# Patient Record
Sex: Female | Born: 1960 | Race: White | Hispanic: No | Marital: Single | Smoking: Current every day smoker
Health system: Southern US, Community
[De-identification: ages and names within clinical notes are randomized; demographics above are authoritative.]

## PROBLEM LIST (undated history)

## (undated) DIAGNOSIS — R7989 Other specified abnormal findings of blood chemistry: Secondary | ICD-10-CM

## (undated) DIAGNOSIS — C539 Malignant neoplasm of cervix uteri, unspecified: Secondary | ICD-10-CM

## (undated) DIAGNOSIS — D696 Thrombocytopenia, unspecified: Secondary | ICD-10-CM

## (undated) DIAGNOSIS — D751 Secondary polycythemia: Secondary | ICD-10-CM

## (undated) HISTORY — DX: Secondary polycythemia: D75.1

## (undated) HISTORY — DX: Thrombocytopenia, unspecified: D69.6

## (undated) HISTORY — PX: CHOLECYSTECTOMY: SHX55

## (undated) HISTORY — DX: Malignant neoplasm of cervix uteri, unspecified: C53.9

## (undated) HISTORY — PX: TUBAL LIGATION: SHX77

## (undated) HISTORY — PX: BREAST SURGERY: SHX581

## (undated) HISTORY — DX: Other specified abnormal findings of blood chemistry: R79.89

---

## 2000-12-31 ENCOUNTER — Encounter: Payer: Self-pay | Admitting: Obstetrics and Gynecology

## 2000-12-31 ENCOUNTER — Ambulatory Visit (HOSPITAL_COMMUNITY): Admission: RE | Admit: 2000-12-31 | Discharge: 2000-12-31 | Payer: Self-pay | Admitting: Obstetrics and Gynecology

## 2003-03-26 HISTORY — PX: ABDOMINAL HYSTERECTOMY: SHX81

## 2003-11-10 ENCOUNTER — Observation Stay (HOSPITAL_COMMUNITY): Admission: RE | Admit: 2003-11-10 | Discharge: 2003-11-11 | Payer: Self-pay | Admitting: Obstetrics and Gynecology

## 2005-08-08 ENCOUNTER — Ambulatory Visit: Payer: Self-pay | Admitting: Internal Medicine

## 2005-08-26 ENCOUNTER — Ambulatory Visit: Payer: Self-pay | Admitting: Internal Medicine

## 2006-03-21 ENCOUNTER — Emergency Department: Payer: Self-pay | Admitting: Internal Medicine

## 2007-03-26 LAB — CONVERTED CEMR LAB
Pap Smear: NORMAL
Pap Smear: NORMAL

## 2007-05-19 IMAGING — US ULTRASOUND LEFT BREAST
1 series · 15 of 15 positions shown · non-contrast
Comparison: none

REASON FOR EXAM: LEFT breast mass
COMMENTS:

[Series 1: ultrasound left breast · 15 of 15 slices shown]
[im 1/15]
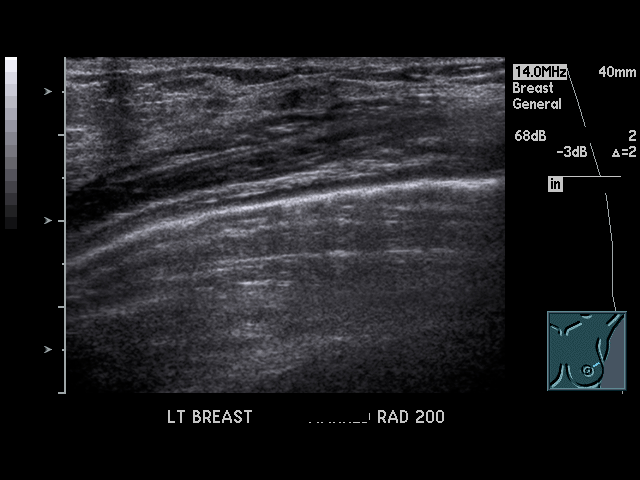
[im 2/15]
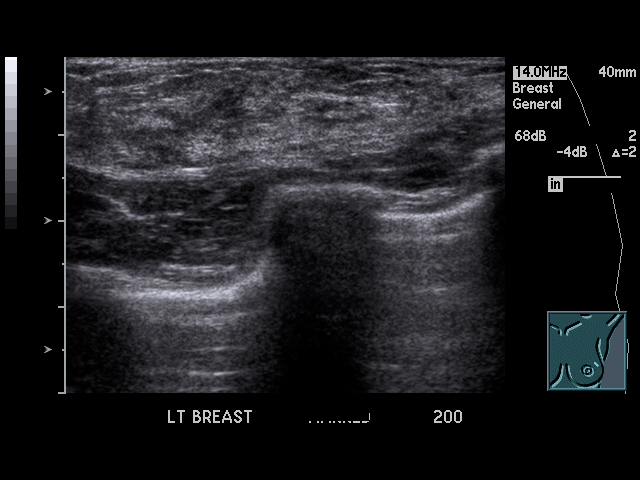
[im 3/15]
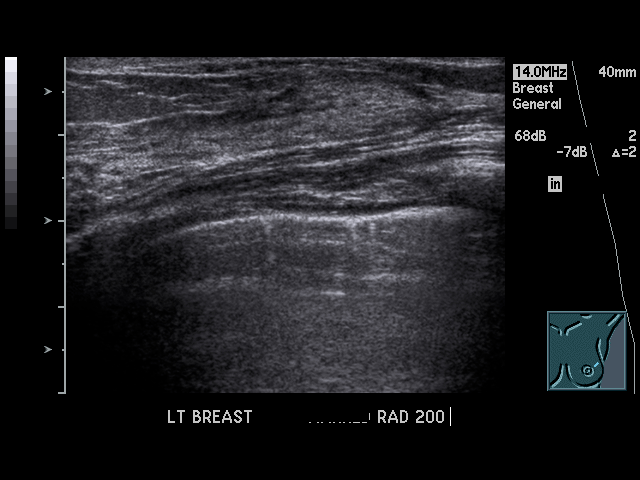
[im 4/15]
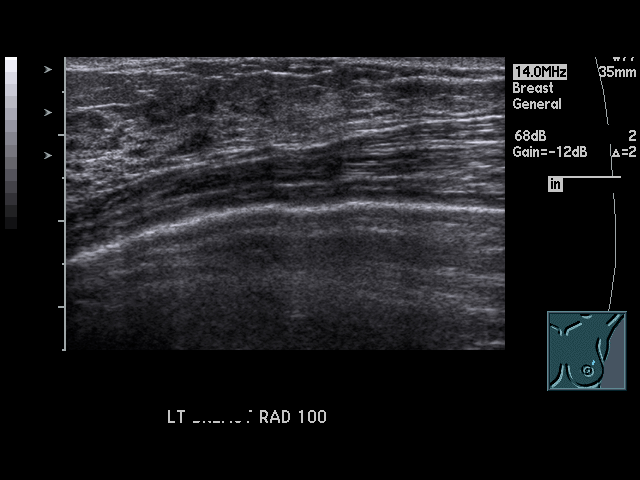
[im 5/15]
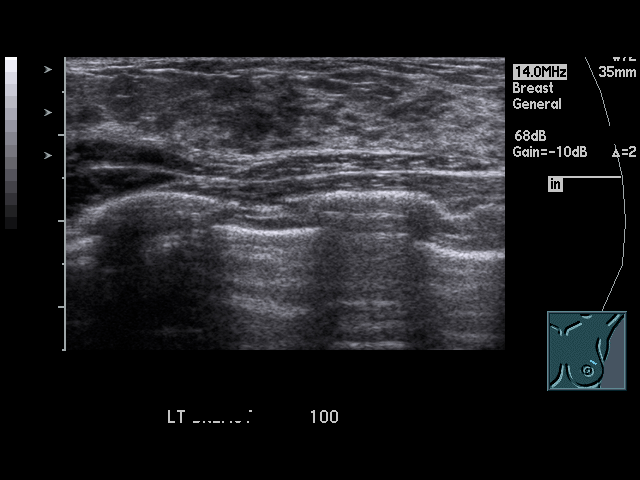
[im 6/15]
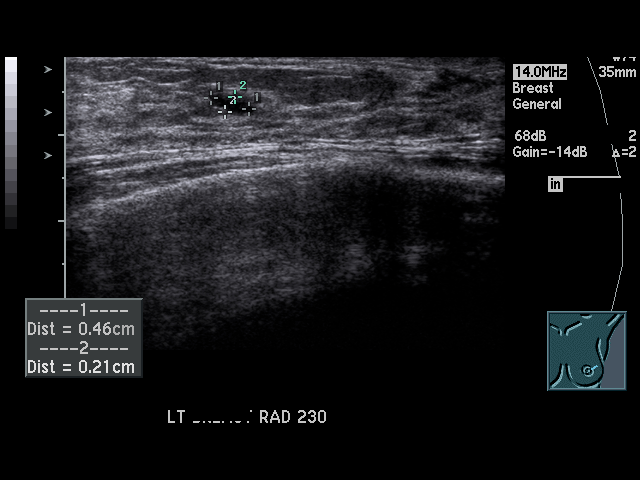
[im 7/15]
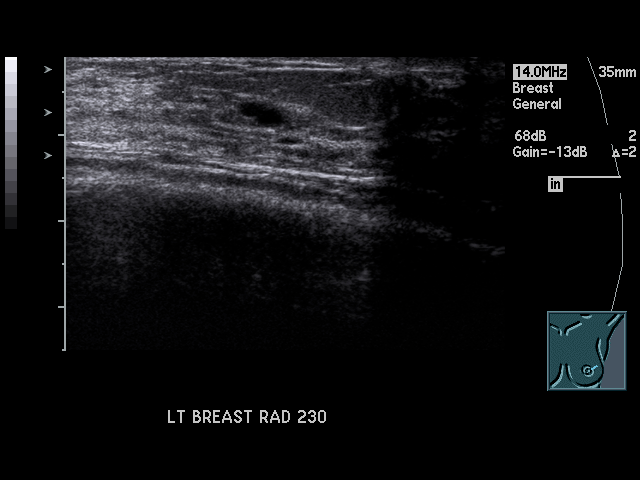
[im 8/15]
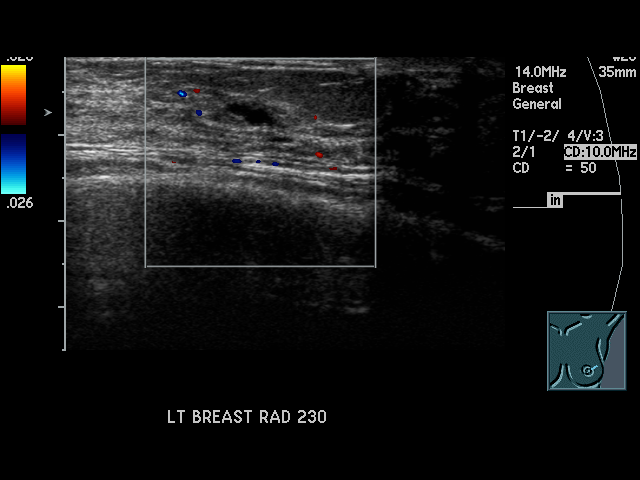
[im 9/15]
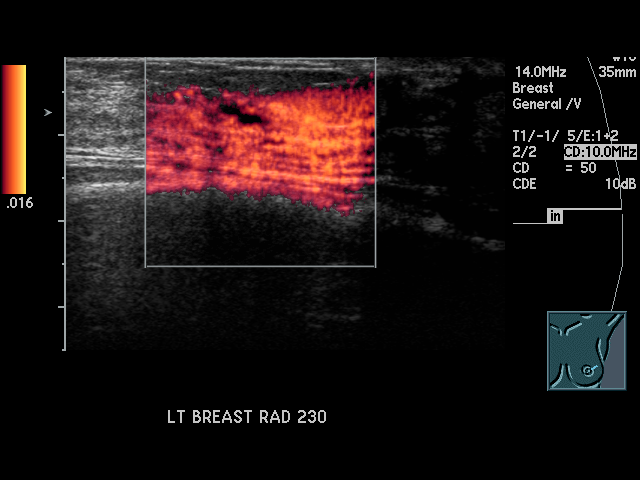
[im 10/15]
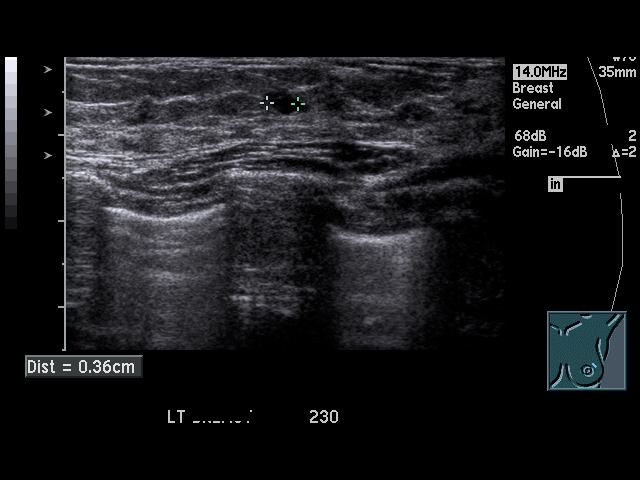
[im 11/15]
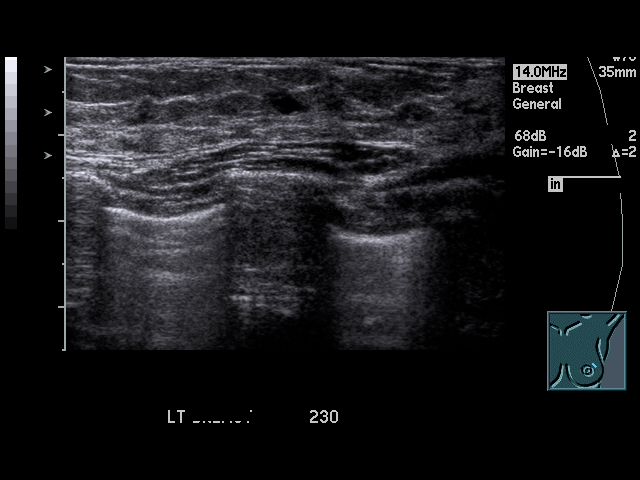
[im 12/15]
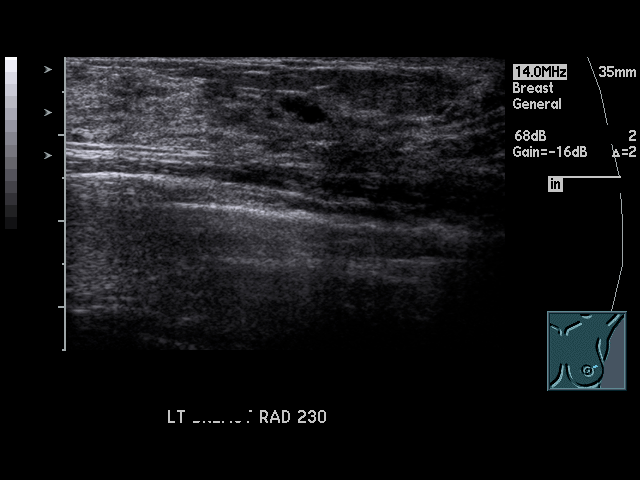
[im 13/15]
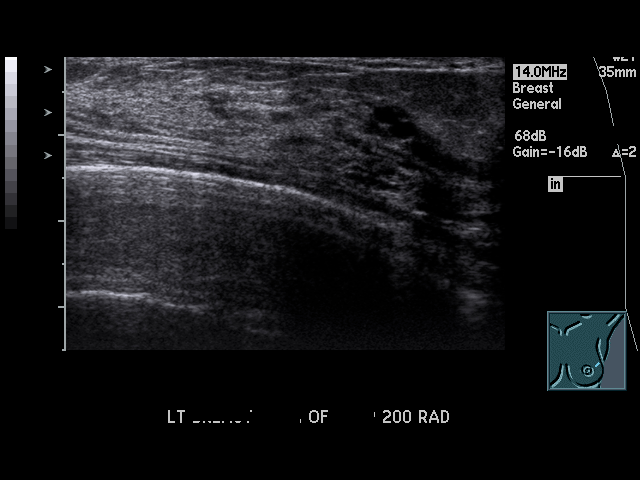
[im 14/15]
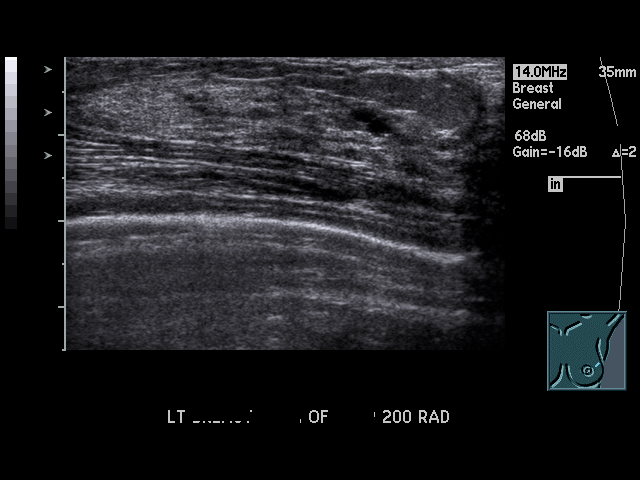
[im 15/15]
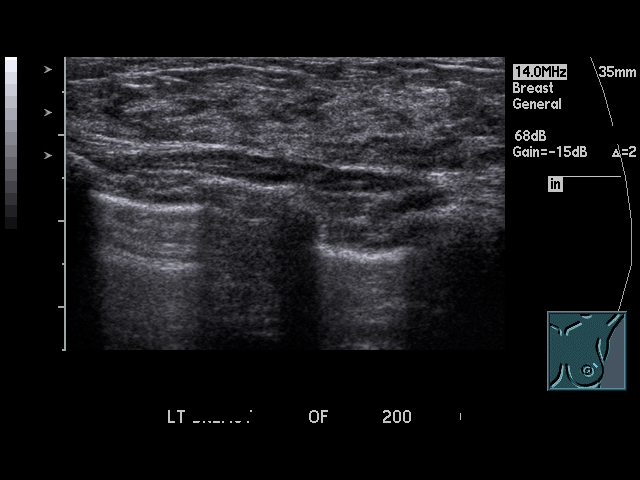

[15 of 15 positions shown; findings below may reference images not displayed]

PROCEDURE:     US  - US BREAST LEFT  - August 08, 2005  [DATE]

RESULT:          The study was presented for evaluation on 08/16/2005.

The region of palpable abnormality was evaluated.  This was evaluated from
the 1 o'clock to [DATE] position.  At the [DATE] position, a lobulated area of
what appear to be two adjacent cysts are identified.  These areas
demonstrate increased through transmission, echogenic sonographic
architecture, and an imperceptible wall.  There appears to be a septum or
possibly interface between the walls along the center of these areas.  There
is no evidence of flow appreciated.  No further sonographic abnormalities
are appreciated.
IMPRESSION: Findings which appear to be consistent with a cluster
of benign cysts in the region of palpable abnormality.  The entire cluster
measures approximately 4.6 x 2.1 mm.  Please refer to the bilateral
mammogram for a completed discussion.

## 2007-12-30 IMAGING — CR DG ANKLE COMPLETE 3+V*L*
1 series · 5 of 5 positions shown · non-contrast
Comparison: none

REASON FOR EXAM: swelling, pain
COMMENTS:

PROCEDURE:     DXR - DXR ANKLE LEFT COMPLETE  - March 21, 2006 [DATE]
RESULT:     Five views of the LEFT ankle show no fracture, dislocation, or
other acute bony abnormality.  The ankle mortise is well maintained.  There
is noted soft tissue swelling about the lateral malleolus.

[Series 1: view not recorded · 0.17mm/px · 5 of 5 slices shown]
[im 1/5]
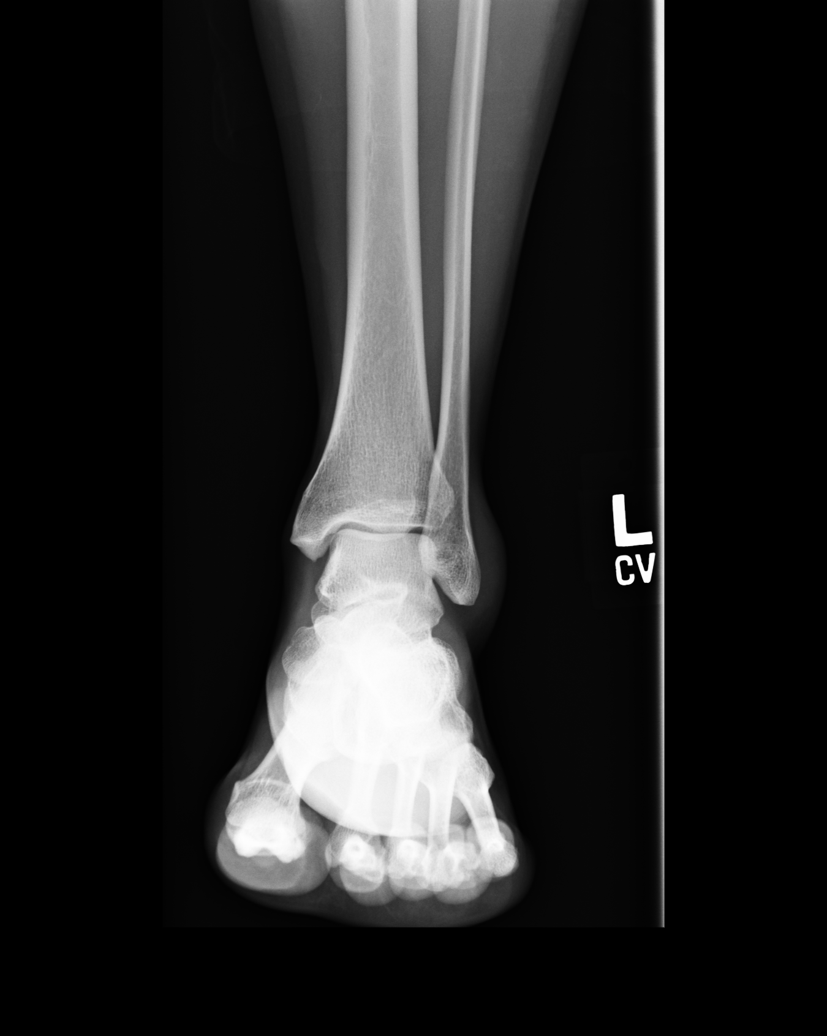
[im 2/5]
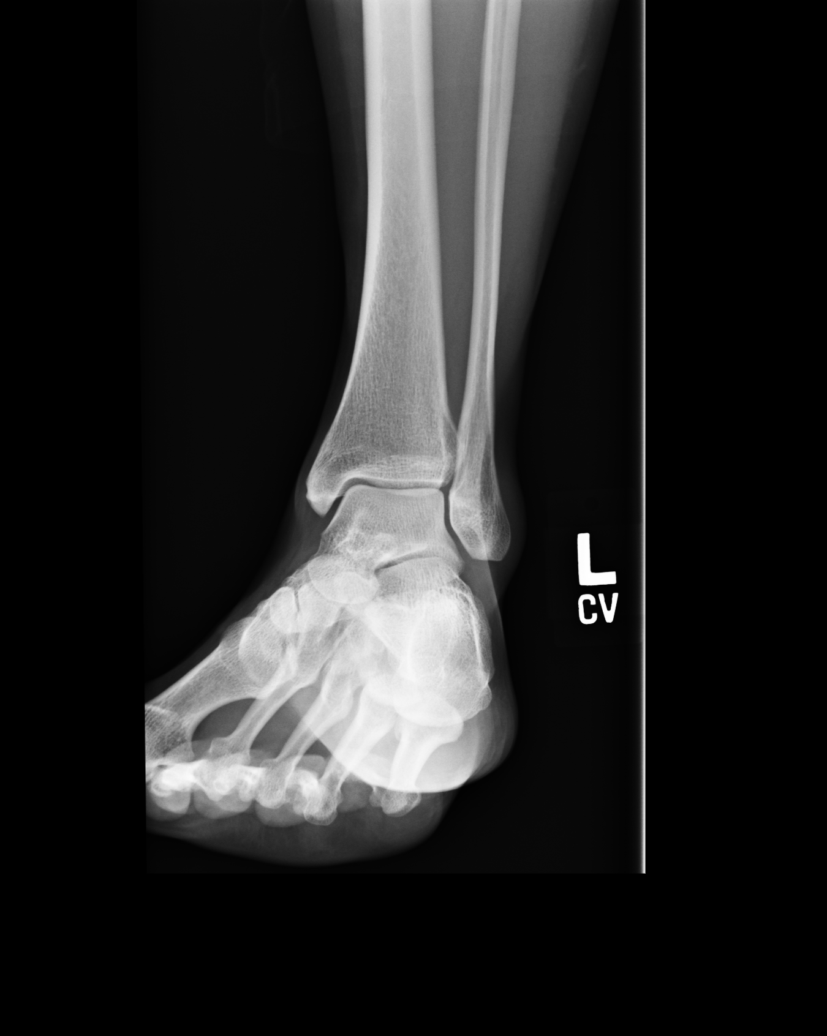
[im 3/5]
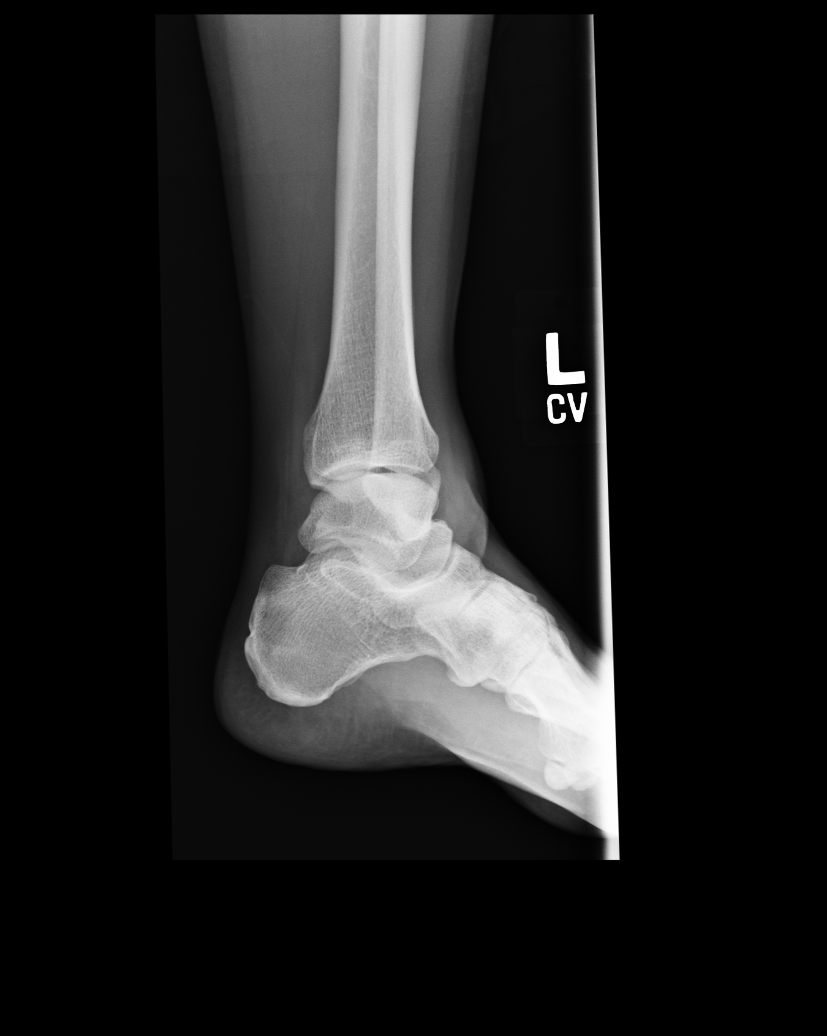
[im 4/5]
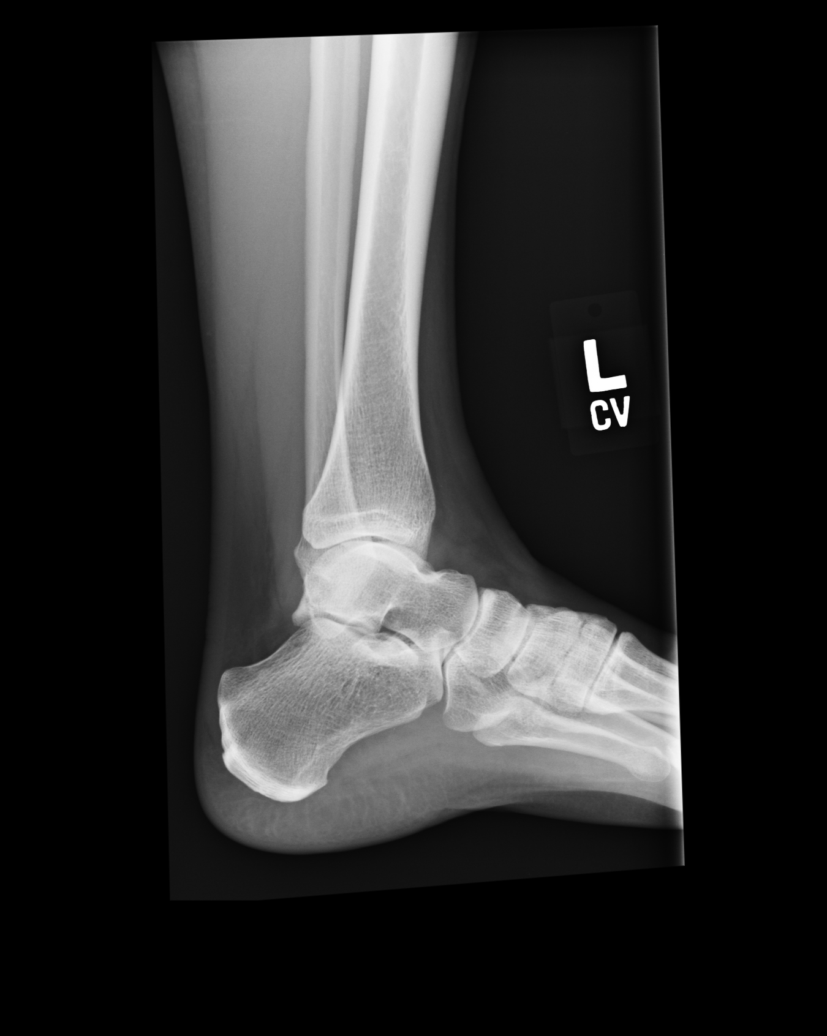
[im 5/5]
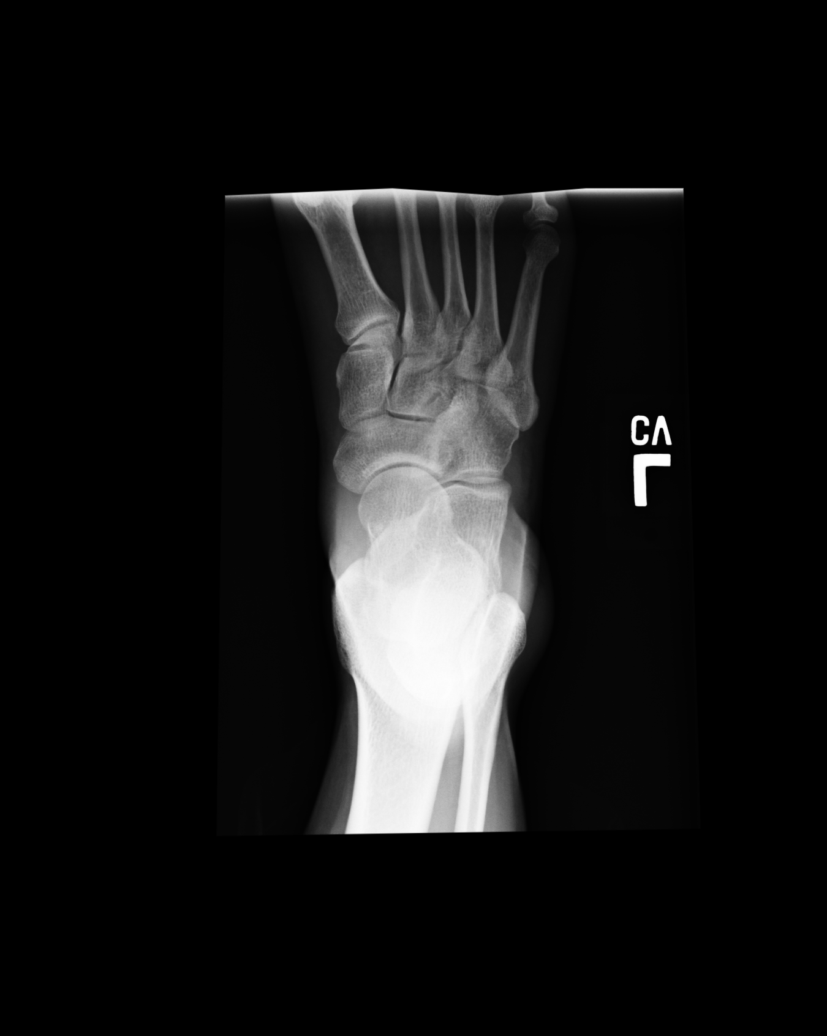

[5 of 5 positions shown; findings below may reference images not displayed]

IMPRESSION: No acute bony abnormalities are identified.

## 2008-09-30 ENCOUNTER — Ambulatory Visit: Payer: Self-pay | Admitting: Family Medicine

## 2008-09-30 DIAGNOSIS — G43009 Migraine without aura, not intractable, without status migrainosus: Secondary | ICD-10-CM | POA: Insufficient documentation

## 2008-09-30 DIAGNOSIS — Z8659 Personal history of other mental and behavioral disorders: Secondary | ICD-10-CM

## 2008-09-30 DIAGNOSIS — F17200 Nicotine dependence, unspecified, uncomplicated: Secondary | ICD-10-CM

## 2008-09-30 DIAGNOSIS — R413 Other amnesia: Secondary | ICD-10-CM

## 2008-09-30 DIAGNOSIS — J309 Allergic rhinitis, unspecified: Secondary | ICD-10-CM | POA: Insufficient documentation

## 2008-10-07 ENCOUNTER — Ambulatory Visit: Payer: Self-pay | Admitting: Family Medicine

## 2008-10-10 LAB — CONVERTED CEMR LAB
ALT: 15 units/L (ref 0–35)
AST: 16 units/L (ref 0–37)
Albumin: 4.1 g/dL (ref 3.5–5.2)
Alkaline Phosphatase: 56 units/L (ref 39–117)
BUN: 11 mg/dL (ref 6–23)
Basophils Absolute: 0 10*3/uL (ref 0.0–0.1)
Basophils Relative: 0.3 % (ref 0.0–3.0)
Bilirubin, Direct: 0.1 mg/dL (ref 0.0–0.3)
CO2: 28 meq/L (ref 19–32)
Calcium: 9.3 mg/dL (ref 8.4–10.5)
Chloride: 106 meq/L (ref 96–112)
Cholesterol: 199 mg/dL (ref 0–200)
Creatinine, Ser: 1 mg/dL (ref 0.4–1.2)
Eosinophils Absolute: 0.3 10*3/uL (ref 0.0–0.7)
Eosinophils Relative: 5.5 % — ABNORMAL HIGH (ref 0.0–5.0)
GFR calc non Af Amer: 63.25 mL/min (ref >60)
Glucose, Bld: 93 mg/dL (ref 70–99)
HCT: 41 % (ref 36.0–46.0)
HDL: 49.2 mg/dL (ref >39.00)
Hemoglobin: 14.2 g/dL (ref 12.0–15.0)
LDL Cholesterol: 138 mg/dL — ABNORMAL HIGH (ref 0–99)
Lymphocytes Relative: 28.3 % (ref 12.0–46.0)
Lymphs Abs: 1.6 10*3/uL (ref 0.7–4.0)
MCHC: 34.6 g/dL (ref 30.0–36.0)
MCV: 92 fL (ref 78.0–100.0)
Monocytes Absolute: 0.5 10*3/uL (ref 0.1–1.0)
Monocytes Relative: 8.2 % (ref 3.0–12.0)
Neutro Abs: 3.3 10*3/uL (ref 1.4–7.7)
Neutrophils Relative %: 57.7 % (ref 43.0–77.0)
Platelets: 152 10*3/uL (ref 150.0–400.0)
Potassium: 4.1 meq/L (ref 3.5–5.1)
RBC: 4.46 M/uL (ref 3.87–5.11)
RDW: 12.3 % (ref 11.5–14.6)
Sodium: 139 meq/L (ref 135–145)
TSH: 0.92 microintl units/mL (ref 0.35–5.50)
Total Bilirubin: 1 mg/dL (ref 0.3–1.2)
Total CHOL/HDL Ratio: 4
Total Protein: 6.7 g/dL (ref 6.0–8.3)
Triglycerides: 58 mg/dL (ref 0.0–149.0)
VLDL: 11.6 mg/dL (ref 0.0–40.0)
Vit D, 25-Hydroxy: 60 ng/mL (ref 30–89)
Vitamin B-12: 1034 pg/mL — ABNORMAL HIGH (ref 211–911)
WBC: 5.7 10*3/uL (ref 4.5–10.5)

## 2008-10-28 ENCOUNTER — Ambulatory Visit: Payer: Self-pay | Admitting: Family Medicine

## 2008-11-10 ENCOUNTER — Ambulatory Visit: Payer: Self-pay | Admitting: Family Medicine

## 2008-11-10 DIAGNOSIS — K1120 Sialoadenitis, unspecified: Secondary | ICD-10-CM

## 2008-11-10 DIAGNOSIS — J069 Acute upper respiratory infection, unspecified: Secondary | ICD-10-CM | POA: Insufficient documentation

## 2009-01-06 ENCOUNTER — Ambulatory Visit: Payer: Self-pay | Admitting: Obstetrics and Gynecology

## 2009-03-06 ENCOUNTER — Ambulatory Visit: Payer: Self-pay | Admitting: Family Medicine

## 2009-03-06 DIAGNOSIS — M79609 Pain in unspecified limb: Secondary | ICD-10-CM

## 2010-08-10 NOTE — Discharge Summary (Signed)
NAME:  Meghan Lucero, Meghan Lucero                         ACCOUNT NO.:  [id number]   MEDICAL RECORD NO.:  [id number]                   PATIENT TYPE:  INP   LOCATION:  A420                                 FACILITY:  [facility]   PHYSICIAN:  Tilda Burrow, M.D.              DATE OF BIRTH:  09-06-1960   DATE OF ADMISSION:  11/10/2003  DATE OF DISCHARGE:                                 DISCHARGE SUMMARY   ADMITTING DIAGNOSES:  1. Pelvic relaxation.  2. Rectocele.  3. Dysmenorrhea.   DISCHARGE DIAGNOSES:  1. Pelvic relaxation.  2. Rectocele.  3. Dysmenorrhea.   PROCEDURE:  Vaginal hysterectomy and posterior repair on 11/10/2003.   DISCHARGE MEDICATIONS:  1. Vicodin 5/500 #20 one to two q.4h. p.r.n. pain.  2. Colace stool softener twice daily for 30 days.  3. Limitations including:  No driving x1 week and no heavy lifting x 1 month     with standard postoperative monitoring of temperature and overall     condition.   DETAILS OF HOSPITALIZATION:  [place] was admitted on the 18th and underwent  uncomplicated vaginal hysterectomy and posterior repair as described in the  operative note and then went home the following morning.  Postoperative  hemoglobin was 12.5, hematocrit 35 compared to 15 and 42 respectively preop.  She will be followed up in 4 weeks in our office or earlier p.r.n. fever,  increased discomfort, or patient concerns.     ___________________________________________                                         Tilda Burrow, M.D.   JVF/MEDQ  D:  11/11/2003  T:  11/11/2003  Job:  [id number]

## 2010-08-10 NOTE — Op Note (Signed)
NAME:  Meghan Lucero, Meghan Lucero                         ACCOUNT NO.:  [id number]   MEDICAL RECORD NO.:  [id number]                   PATIENT TYPE:  INP   LOCATION:  A420                                 FACILITY:  [facility]   PHYSICIAN:  Tilda Burrow, M.D.              DATE OF BIRTH:  August 27, 1960   DATE OF PROCEDURE:  11/10/2003  DATE OF DISCHARGE:                                 OPERATIVE REPORT   PREOPERATIVE DIAGNOSES:  1. Pelvic relaxation with rectocele.  2. Dysmenorrhea.   POSTOPERATIVE DIAGNOSES:  1. Pelvic relaxation with rectocele.  2. Dysmenorrhea.   PROCEDURE:  1. Vaginal hysterectomy.  2. Posterior repair.   SURGEON:  Tilda Burrow, M.D.   ASSISTANTAsencion Noble, CST-FA   ANESTHESIA:  General   COMPLICATIONS:  None.   ESTIMATED BLOOD LOSS:  50 cc.   FINDINGS:  Uterine descensus to introitus with light traction.  Lax  posterior peritoneal support.   DETAILS OF PROCEDURE:  The patient was taken to the operating room and  prepped and draped for vaginal procedure with high leg support.  The patient  has posterior colpotomy incision performed with ease with weighted speculum  in the vagina.  A vaginal bib had been placed over the perineum.  The  uterosacral ligaments were clamped, cut and tagged with #0 chromic.  The  anterior cervical vaginal fornix was entered and the bladder elevated and  the anterior peritoneum opened.  The lateral vaginal mucosa was released and  then lower cardinal ligaments clamped, cut, and suture ligated with #0  chromic.  The upper cardinal ligaments and broad ligaments were serially  clamped, cut and suture ligated in a serial fashion using small bites and #0  chromic ligature.  The ovaries and tubes could be visualized and were  grossly normal.  They were left in situ.  The hysterectomy was quite easy  with minimal blood loss.  The pedicles were inspected and found hemostatic.  The peritoneum closed in a transverse running fashion rather than  purse-  string.  The uterosacral ligaments were pulled slightly together in the  midline with a 3-0 monofilament suture placed approximately 8-10 mm above  the vaginal cuff catching the inner portions of uterosacral ligaments.  The  cuff, itself, was then closed in a transverse fashion with good hemostasis  and pelvic support.   POSTERIOR REPAIR:  Posterior repair involved opening the posterior vaginal  mucosa at the introitus, elevating the vaginal mucosa off the underlying  connective tissue, doing a double gloved digital exam with the right index  finger, using Allis clamps to grasp perirectal tissue on either side and  pulling it into the midline.  Whereupon a series of #0 Dexon, interrupted  mattress sutures were placed to rebuild the perineal body catching  __________ cavernosus tissues and rebuilding the levator plate.   The vaginal mucosa was pulled together in the midline and minimal trimming  performed.  The patient tolerated the procedure well with excellent  hemostasis, and so went to the recovery room without vaginal packing.  Foley  catheter was placed with clear urine obtained.   ESTIMATED BLOOD LOSS:  50 cc.      ___________________________________________                                            Tilda Burrow, M.D.   JVF/MEDQ  D:  11/11/2003  T:  11/11/2003  Job:  [id number]

## 2011-04-05 ENCOUNTER — Ambulatory Visit: Payer: Self-pay | Admitting: Obstetrics and Gynecology

## 2011-06-13 ENCOUNTER — Ambulatory Visit (INDEPENDENT_AMBULATORY_CARE_PROVIDER_SITE_OTHER): Payer: Medicare HMO | Admitting: Family Medicine

## 2011-06-13 ENCOUNTER — Encounter: Payer: Self-pay | Admitting: Family Medicine

## 2011-06-13 ENCOUNTER — Encounter: Payer: Self-pay | Admitting: *Deleted

## 2011-06-13 VITALS — BP 120/72 | HR 89 | Temp 97.9°F | Ht 60.0 in | Wt 123.4 lb

## 2011-06-13 DIAGNOSIS — J020 Streptococcal pharyngitis: Secondary | ICD-10-CM

## 2011-06-13 DIAGNOSIS — J029 Acute pharyngitis, unspecified: Secondary | ICD-10-CM

## 2011-06-13 DIAGNOSIS — J309 Allergic rhinitis, unspecified: Secondary | ICD-10-CM

## 2011-06-13 LAB — POCT RAPID STREP A (OFFICE): Rapid Strep A Screen: POSITIVE — AB

## 2011-06-13 MED ORDER — FLUTICASONE PROPIONATE 50 MCG/ACT NA SUSP: 2.0000 | Freq: Every day | NASAL | Status: AC

## 2011-06-13 MED ORDER — OLOPATADINE HCL 0.6 % NA SOLN: 2.0000 | Freq: Two times a day (BID) | NASAL | Status: DC

## 2011-06-13 MED ORDER — PENICILLIN G BENZATHINE & PROC 1200000 UNIT/2ML IM SUSP
1.2000 10*6.[IU] | Freq: Once | INTRAMUSCULAR | Status: AC
  Administered 2011-06-13: 1.2 10*6.[IU] via INTRAMUSCULAR

## 2011-06-13 NOTE — Patient Instructions (Signed)
SORE THROAT -Most caused by infections, 80-85% are viral injections.  -Strep throat is bacterial and requires antibiotics -Drainage and cough can irritate throat  TREATMENT 1. Warm liquids, salt water gargles to help with the sore throat. 2. Chloraseptic as needed can help a lot 3. Cough drops, popsicles, or hard candy 4. Liquids - drink plenty, without caffeine 5. Salt water gargle: 1/2 tsp salt in 1/2 glass warm water 6. Avoid spicy food 7. Get plenty of sleep 8. Ice chips for comfort  

## 2011-06-13 NOTE — Progress Notes (Signed)
  Patient Name: [name] Date of Birth: June 30, 1960 Age: 51 y.o. Medical Record Number: [id number] Gender: female Date of Encounter: 06/13/2011  History of Present Illness:  Meghan Lucero is a 51 y.o. very pleasant female patient who presents with the following:  Yest woke up and throat was really hurting bad. Fever, some stuff going down throat.   Patient woke up yesterday, she felt feverish, and her throat is hurting quite significantly. She is also been achy, had a headache. She has also had some congestion over the last few days. Minimal cough. She has taken some cough medications without much success, but her primary complaint is her significant sore throat.  She also brings in some records and allergy testing and also reviewed and will scan in the chart.  Past Medical History, Surgical History, Social History, Family History, Problem List, Medications, and Allergies have been reviewed and updated if relevant.  Review of Systems: ROS: GEN: Acute illness details above GI: Tolerating PO intake GU: maintaining adequate hydration and urination Pulm: No SOB Interactive and getting along well at home.  Otherwise, ROS is as per the HPI.   Physical Examination: Filed Vitals:   06/13/11 1004  BP: 120/72  Pulse: 89  Temp: 97.9 F (36.6 C)  TempSrc: Oral  Height: 5' (1.524 m)  Weight: 123 lb 6.4 oz (55.974 kg)  SpO2: 99%    Body mass index is 24.10 kg/(m^2).   Gen: WDWN, NAD; A & O x3, cooperative. Pleasant.Globally Non-toxic HEENT: Normocephalic and atraumatic. Throat:  without exudate R TM clear, L TM - good landmarks, No fluid present. rhinnorhea. No frontal or maxillary sinus T. MMM NECK: Anterior cervical  LAD mild CV: RRR, No M/G/R, cap refill <2 sec PULM: Breathing comfortably in no respiratory distress. no wheezing, crackles, rhonchi ABD: S,NT,ND,+BS. No HSM. No rebound. EXT: No c/c/e PSYCH: Friendly, good eye contact   Assessment and Plan:  1. Sore throat   POCT rapid strep A  2. Strep throat  penicillin g procaine-penicillin g benzathine (BICILLIN-CR) injection 600000-600000 units  3. ALLERGIC RHINITIS      Orders Today: Orders Placed This Encounter  Procedures  . POCT rapid strep A    Medications Today: Meds ordered this encounter  Medications  . DISCONTD: cholecalciferol (VITAMIN D) 1000 UNITS tablet    Sig: Take 2,000 Units by mouth daily.  Marland Kitchen DISCONTD: pyridOXINE (VITAMIN B-6) 100 MG tablet    Sig: Take 100 mg by mouth daily.  Marland Kitchen DISCONTD: vitamin E 600 UNIT capsule    Sig: Take 600 Units by mouth daily.  . fluticasone (FLONASE) 50 MCG/ACT nasal spray    Sig: Place 2 sprays into the nose daily.    Dispense:  16 g    Refill:  12  . Olopatadine HCl 0.6 % SOLN    Sig: Place 2 drops (2 puffs total) into the nose 2 (two) times daily.    Dispense:  1 Bottle    Refill:  11  . penicillin g procaine-penicillin g benzathine (BICILLIN-CR) injection 600000-600000 units    Sig:     Rapid Strep A Screen  Date Value Range Status  06/13/2011 Positive* Negative (no units) Final   Bicillin LA in the office. 1.2 million units. Refillable allergy medication.

## 2012-04-06 ENCOUNTER — Ambulatory Visit (INDEPENDENT_AMBULATORY_CARE_PROVIDER_SITE_OTHER): Payer: Medicare HMO | Admitting: Family Medicine

## 2012-04-06 ENCOUNTER — Encounter: Payer: Self-pay | Admitting: Family Medicine

## 2012-04-06 VITALS — BP 122/80 | HR 86 | Temp 98.0°F | Wt 126.0 lb

## 2012-04-06 DIAGNOSIS — J029 Acute pharyngitis, unspecified: Secondary | ICD-10-CM

## 2012-04-06 DIAGNOSIS — H6690 Otitis media, unspecified, unspecified ear: Secondary | ICD-10-CM

## 2012-04-06 MED ORDER — AMOXICILLIN 875 MG PO TABS: 875.0000 mg | ORAL_TABLET | Freq: Two times a day (BID) | ORAL | Status: AC

## 2012-04-06 NOTE — Progress Notes (Signed)
Smoker.  Woke up with ST 2 days ago.  Ear pain, x2.  Stuffy > runny nose.  Possible fevers, not checked. She had chills and felt hot.  No vomiting, some diarrhea.  No myalgias except for neck and shoulders at baseline.  Some facial pain, better when getting out of a hot shower.  Known sick contacts.    She can get a flu shot when she is well.  She missed the day for shots at work.    Meds, vitals, and allergies reviewed.   ROS: See HPI.  Otherwise, noncontributory.  GEN: nad, alert and oriented HEENT: mucous membranes moist, R tm w/o erythema. L TM red and bulging, nasal exam w/o erythema, clear discharge noted,  OP with cobblestoning NECK: supple w/o LA CV: rrr.   PULM: ctab, no inc wob EXT: no edema SKIN: no acute rash

## 2012-04-06 NOTE — Assessment & Plan Note (Signed)
Amoxil, rest, fluids, f/u prn.  Nontoxic. D/w pt.  She understood.  F/u prn.

## 2012-04-06 NOTE — Patient Instructions (Addendum)
Start the antibiotics today and get some rest.  Take care and drink plenty of fluids.

## 2012-05-24 ENCOUNTER — Other Ambulatory Visit: Payer: Self-pay | Admitting: Oncology

## 2013-04-09 ENCOUNTER — Ambulatory Visit: Payer: Self-pay | Admitting: Obstetrics and Gynecology

## 2013-05-22 ENCOUNTER — Telehealth: Payer: Self-pay | Admitting: *Deleted

## 2013-05-22 ENCOUNTER — Encounter: Payer: Self-pay | Admitting: *Deleted

## 2013-05-22 NOTE — Telephone Encounter (Signed)
Former pt of DR. G...td  

## 2014-07-13 ENCOUNTER — Ambulatory Visit
Admit: 2014-07-13 | Disposition: A | Discharge status: Home / Self Care | Payer: Self-pay | Attending: Internal Medicine | Admitting: Internal Medicine

## 2014-07-13 LAB — IRON AND TIBC
IRON BIND. CAP.(TOTAL): 368 (ref 250–450)
IRON: 92 ug/dL
Iron Saturation: 25
UNBOUND IRON-BIND. CAP.: 276.5

## 2014-07-13 LAB — CBC CANCER CENTER
Basophil #: 0.1 x10 3/mm (ref 0.0–0.1)
Basophil %: 0.8 %
EOS PCT: 1.6 %
Eosinophil #: 0.1 x10 3/mm (ref 0.0–0.7)
Eosinophil: 2 %
HCT: 42.8 % (ref 35.0–47.0)
HGB: 14.1 g/dL (ref 12.0–16.0)
LYMPHS ABS: 2.8 x10 3/mm (ref 1.0–3.6)
LYMPHS PCT: 41 %
Lymphocyte %: 37.9 %
MCH: 30.2 pg (ref 26.0–34.0)
MCHC: 33 g/dL (ref 32.0–36.0)
MCV: 92 fL (ref 80–100)
MONO ABS: 0.5 x10 3/mm (ref 0.2–0.9)
Monocyte %: 6.9 %
Monocytes: 7 %
Neutrophil #: 3.9 x10 3/mm (ref 1.4–6.5)
Neutrophil %: 52.8 %
Platelet: 78 x10 3/mm — ABNORMAL LOW (ref 150–440)
RBC: 4.68 10*6/uL (ref 3.80–5.20)
RDW: 13.7 % (ref 11.5–14.5)
Segmented Neutrophils: 42 %
VARIANT LYMPHOCYTE - H4-RLYMPH: 8 %
WBC: 7.4 x10 3/mm (ref 3.6–11.0)

## 2014-07-13 LAB — APTT: ACTIVATED PTT: 28 s (ref 23.6–35.9)

## 2014-07-13 LAB — FIBRINOGEN: Fibrinogen: 420 mg/dL (ref 210–470)

## 2014-07-13 LAB — RETICULOCYTES
Absolute Retic Count: 0.0538 10*6/uL (ref 0.019–0.186)
RETICULOCYTE: 1.15 % (ref 0.4–3.1)

## 2014-07-13 LAB — FOLATE: Folic Acid: 15 ng/mL

## 2014-07-13 LAB — PROTIME-INR
INR: 0.9
PROTHROMBIN TIME: 12.8 s

## 2014-07-13 LAB — LACTATE DEHYDROGENASE: LDH: 141 U/L

## 2014-07-13 LAB — FERRITIN: FERRITIN (ARMC): 25 ng/mL

## 2014-07-15 LAB — PROT IMMUNOELECTROPHORES(ARMC)

## 2014-07-26 ENCOUNTER — Other Ambulatory Visit: Payer: Self-pay | Admitting: *Deleted

## 2014-07-26 DIAGNOSIS — D696 Thrombocytopenia, unspecified: Secondary | ICD-10-CM

## 2014-07-26 MED ORDER — LIDOCAINE HCL 2 % IJ SOLN: 20.0000 mL | Freq: Once | INTRAMUSCULAR | Status: DC

## 2014-07-27 ENCOUNTER — Inpatient Hospital Stay: Payer: 59 | Attending: Internal Medicine

## 2014-07-27 ENCOUNTER — Inpatient Hospital Stay (HOSPITAL_BASED_OUTPATIENT_CLINIC_OR_DEPARTMENT_OTHER): Payer: 59

## 2014-07-27 ENCOUNTER — Other Ambulatory Visit: Payer: Self-pay | Admitting: *Deleted

## 2014-07-27 ENCOUNTER — Inpatient Hospital Stay: Payer: 59 | Admitting: Internal Medicine

## 2014-07-27 DIAGNOSIS — F520 Hypoactive sexual desire disorder: Secondary | ICD-10-CM | POA: Insufficient documentation

## 2014-07-27 DIAGNOSIS — Z8541 Personal history of malignant neoplasm of cervix uteri: Secondary | ICD-10-CM | POA: Diagnosis not present

## 2014-07-27 DIAGNOSIS — D696 Thrombocytopenia, unspecified: Secondary | ICD-10-CM

## 2014-07-27 LAB — CBC WITH DIFFERENTIAL/PLATELET
Basophils Absolute: 0.1 10*3/uL (ref 0–0.1)
Basophils Relative: 1 %
Eosinophils Absolute: 0.1 10*3/uL (ref 0–0.7)
HEMATOCRIT: 46.9 % (ref 35.0–47.0)
HEMOGLOBIN: 15.6 g/dL (ref 12.0–16.0)
LYMPHS ABS: 2 10*3/uL (ref 1.0–3.6)
Lymphocytes Relative: 30 %
MCH: 30 pg (ref 26.0–34.0)
MCHC: 33.3 g/dL (ref 32.0–36.0)
MCV: 90.1 fL (ref 80.0–100.0)
MONO ABS: 0.6 10*3/uL (ref 0.2–0.9)
Monocytes Relative: 8 %
Neutro Abs: 3.9 10*3/uL (ref 1.4–6.5)
Neutrophils Relative %: 59 %
Platelets: 73 10*3/uL — ABNORMAL LOW (ref 150–440)
RBC: 5.21 MIL/uL — AB (ref 3.80–5.20)
RDW: 13.7 % (ref 11.5–14.5)
WBC: 6.6 10*3/uL (ref 3.6–11.0)

## 2014-07-27 LAB — RETICULOCYTES
RBC.: 5.21 MIL/uL — AB (ref 3.80–5.20)
RETIC COUNT ABSOLUTE: 52.1 10*3/uL (ref 19.0–183.0)
RETIC CT PCT: 1 % (ref 0.4–3.1)

## 2014-07-27 NOTE — Patient Instructions (Signed)
Instructions for After Your Bone Marrow Biopsy  1. Rest for 4-6 hours  2. You may shower tonight  3. You may remove the Band-Aid tomorrow morning.    Call Dept: [phone] if you have any signs of the following:  1. Bleeding or large bruising at the bone marrow biopsy site.   2. Fever greater than 101 degrees.  3. Pain or swelling over the hip region.   4. Numbness and/or tingling over the hip or down the leg.

## 2014-07-27 NOTE — Progress Notes (Signed)
Bone Marrow Procedure Note -  Indication: Thrombocytopenia of unclear etiology, evaluate for myelodysplasia.  Procedure explained and consent obtained. Under strict aseptic precautions, area was cleaned with Betadine and draped. 2% Lidocaine local anesthetic was given and a bone marrow aspirate and biopsy samples were taken from the left posterior superior iliac crest. Aspirate samples also drawn for flow cytometry, cytogenetics and other ancillary studies as indicated. Patient tolerated procedure well, no complications noted.

## 2014-08-10 ENCOUNTER — Inpatient Hospital Stay (HOSPITAL_BASED_OUTPATIENT_CLINIC_OR_DEPARTMENT_OTHER): Payer: 59 | Admitting: Internal Medicine

## 2014-08-10 VITALS — BP 117/71 | HR 68 | Temp 96.4°F | Ht 60.0 in | Wt 124.6 lb

## 2014-08-10 DIAGNOSIS — Z8541 Personal history of malignant neoplasm of cervix uteri: Secondary | ICD-10-CM

## 2014-08-10 DIAGNOSIS — F520 Hypoactive sexual desire disorder: Secondary | ICD-10-CM

## 2014-08-10 DIAGNOSIS — D696 Thrombocytopenia, unspecified: Secondary | ICD-10-CM | POA: Diagnosis not present

## 2014-08-19 ENCOUNTER — Telehealth: Payer: Self-pay | Admitting: *Deleted

## 2014-08-19 NOTE — Telephone Encounter (Addendum)
Asking if it is safe to have all her teeth pulled and get dentures, she is having dental problems. I explained it would be the middle of next week before we get back to her as Dr Ma Hillock is on vacation this week and we are closed Monday. She said she is ok with waiting until then

## 2014-08-21 NOTE — Progress Notes (Signed)
[facility]  [phone] [phone]     ID: Meghan Lucero OB: [birth year]  MR#: [id number]  YBW#:[id number]  Patient Care Team: Jinny Sanders, MD as PCP - General  CHIEF COMPLAINT/DIAGNOSIS:  1. 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 Workup on 07/13/14 - Hb 14.1, WBC 7400, unremarkable differential, platelet 78, large platelets present, absolute reticulocyte 0.0538re. Otherwise iron study, B12, folate, SIEP, HBsAg, HCV Ab, HIV Ab, LDH, haptoglobin all unremarkable. Ultrasound abdomen negative for hepatosplenomegaly. [prior labs on 06/24/14 - platelet count 72K, Hb 15.7, WBC 8800, ANC 4370, ALC 3440, ANA negative, serum Platelet Antibody Test negative (IIb/IIIa Ab, Ib/IX Ab, Ia/IIa Ab, HLA class 1 Ab all negative). On 04/29/14, platelets 86, Hb 14.5, WBC 6900, unremarkable differential. On 04/22/14, platelets 84, Hb 14.8, WBC 5600, unremarkable differential]. Bone marrow biopsy on 07/28/14 - No hematopoietic neoplasia/dysplasia identified. Normocellular marrow for age (~40-60%) with trilineage hematopoiesis, including adequate megakaryocytes. No significant increase in reticulin. Trace amount of storage iron present. Flow Cytometry unremarkable. Cytogenetics normal 46XX.  2. Mild Erythrocytosis - resolved on CBC of 07/13/14. Serum EPO 11 0.6, Carboxyhemoglobin 4%.   HISTORY OF PRESENT ILLNESS:  Patient returns for continued hematology follow-up, she had bone marrow biopsy done. Denies any bleeding symptoms including major skin bruising, epistaxis, gum bleeding, hematemesis, hemoptysis, bright red blood in stools, melena or hematuria. Appetite is steady. States that she takes alcohol but occasionally and in small amounts.   REVIEW OF SYSTEMS:   ROS As in HPI above. In addition, no fever, chills or sweats. No new headaches or focal weakness.  No new mood  disturbances. No  sore throat, cough, shortness of breath, sputum, hemoptysis or chest pain. No dizziness or palpitation. No abdominal pain, constipation, diarrhea, dysuria or hematuria. No new skin rash or bleeding symptoms. No new paresthesias in extremities.  Otherwise, a complete review of systems is negative.  PAST MEDICAL HISTORY: Past Medical History  Diagnosis Date  . 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 Cholecystectomy    . Abdominal hysterectomy    . Breast surgery      FAMILY HISTORY Family History  Problem Relation Age of Onset  . Stroke Father   . COPD Father   . Diabetes Father   . Heart disease Father   . Alzheimer's disease Maternal Grandmother   . Alcohol abuse Paternal Grandfather   . Osteoporosis Paternal Grandfather   Denies malignancy or hematological disorders.  ADVANCED DIRECTIVES:   SOCIAL HISTORY: History  Substance Use Topics  . Smoking status: Current Every Day Smoker  . Smokeless tobacco: Not on file  . Alcohol Use: No  Denies smoking or recreational drug usage. Takes alcohol occasionally and in small amounts. Physically active.  No Known Allergies  Current Outpatient Prescriptions  Medication Sig Dispense Refill  . estradiol (ESTRACE) 1 MG tablet Take 1 mg by mouth daily.    . Olopatadine HCl 0.6 % SOLN Place 2 drops (2 puffs total) into the nose 2 (two) times daily. 1 Bottle 11   No current facility-administered medications for this visit.   Facility-Administered Medications Ordered in Other Visits  Medication Dose Route Frequency Provider  Last Rate Last Dose  . lidocaine (XYLOCAINE) 2 % (with pres) injection 400 mg  20 mL Intradermal Once Leia Alf, MD        OBJECTIVE: Filed Vitals:    08/10/14 1042  BP: 117/71  Pulse: 68  Temp: 96.4 F (35.8 C)     Body mass index is 24.33 kg/(m^2).     GENERAL: Patient is alert and oriented and in no acute distress. There is no icterus. HEENT: EOMs intact. Oral exam negative for thrush or petechiae. ABDOMEN: Soft, nontender. No hepatosplenomegaly clinically.  SKIN: no petechiae, bruising or ecchymosis.   LAB RESULTS:    Component Value Date/Time   NA 139 10/07/2008 0958   K 4.1 10/07/2008 0958   CL 106 10/07/2008 0958   CO2 28 10/07/2008 0958   GLUCOSE 93 10/07/2008 0958   BUN 11 10/07/2008 0958   CREATININE 1.0 10/07/2008 0958   CALCIUM 9.3 10/07/2008 0958   PROT 6.7 10/07/2008 0958   ALBUMIN 4.1 10/07/2008 0958   AST 16 10/07/2008 0958   ALT 15 10/07/2008 0958   ALKPHOS 56 10/07/2008 0958   BILITOT 1.0 10/07/2008 0958   GFRNONAA 63.25 10/07/2008 0958    Lab Results  Component Value Date   WBC 6.6 07/27/2014   NEUTROABS 3.9 07/27/2014   HGB 15.6 07/27/2014   HCT 46.9 07/27/2014   MCV 90.1 07/27/2014   PLT 73* 07/27/2014   06/24/14 - platelet count 72K, Hb 15.7, WBC 8800, unremarkable differential, ANC 4370, ALC 3440, ANA negative, serum Platelet Antibody Test negative (IIb/IIIa Ab, Ib/IX Ab, Ia/IIa Ab, HLA class 1 Ab all negative) 04/29/14 - platelets 86, Hb 14.5, WBC 6900, unremarkable differential. 04/22/14 - platelets 84, Hb 14.8, WBC 5600, unremarkable differential.     07/13/14. Serum EPO 11 0.6, Carboxyhemoglobin 4%.  07/18/14 - Ultrasound Abdomen. IMPRESSION: 1. No evidence for hepatosplenomegaly.   ASSESSMENT / PLAN:   1. Persistent Thrombocytopenia of unclear etiology - ? ITP vs other etiology. Workup on 07/13/14 - Hb 14.1, WBC 7400, unremarkable differential, platelet 78, large platelets present, absolute reticulocyte 0.0538re. Otherwise iron study, B12, folate, SIEP, HBsAg, HCV Ab, HIV Ab, LDH, haptoglobin all unremarkable. Ultrasound abdomen negative for hepatosplenomegaly. Also, ANA and serum Platelet  Antibody Test negative were negative on 06/24/14. Bone marrow biopsy on 07/28/14 - No hematopoietic neoplasia/dysplasia identified. Normocellular marrow for age (~40-60%) with trilineage hematopoiesis, including adequate megakaryocytes. No significant increase in reticulin. Trace amount of storage iron present. Flow Cytometry unremarkable. Cytogenetics normal 46XX.     -      reviewed bone marrow report and d/w patient in detail. No obvious etiology from workup done so far to explain thrombocytopenia, most likely possibilities would be ITP (immune thrombocytopenic purpura) given no evidence of bone marrow disorder. Given mild thrombocytopenia with no bleeding issues, she does not need any treatment at this time and plan is continued monitoring as long as platelet count maintains >30K and if she has no bleeding issues. Will get CBC q8 weeks, next MD followup at 48 weeks with labs. 2. Mild Erythrocytosis - resolved on CBC of 07/13/14. Serum EPO 11 0.6, Carboxyhemoglobin 4%. Patient advised to avoid secondhand smoke exposure and also look for any possibility of carbon monoxide exposure. 3. Advised to completely quit alcohol in case this is affecting her platelet count also. In between visits, the patient has been advised to call or come to the ER in case of fevers, bleeding, acute sickness or new symptoms. Patient is agreeable to this plan.  Leia Alf, MD   08/21/2014 10:38 AM

## 2014-08-23 NOTE — Telephone Encounter (Signed)
Left message for pt to return my call.

## 2014-08-23 NOTE — Telephone Encounter (Signed)
Recommend trying a course of Prednisone to see if ITP will respond. Please find out if patient is agreeable and let me know, and we can then send prescription and make close follow-up appointments to assess for response. Thanks.

## 2014-08-24 NOTE — Telephone Encounter (Signed)
Prefers not to go on steroids , said she will just get a crown on the one that is bothering her right now

## 2014-08-25 ENCOUNTER — Ambulatory Visit (INDEPENDENT_AMBULATORY_CARE_PROVIDER_SITE_OTHER): Payer: 59 | Admitting: Internal Medicine

## 2014-08-25 ENCOUNTER — Encounter: Payer: Self-pay | Admitting: Internal Medicine

## 2014-08-25 VITALS — BP 110/70 | HR 69 | Temp 98.3°F | Wt 121.0 lb

## 2014-08-25 DIAGNOSIS — J029 Acute pharyngitis, unspecified: Secondary | ICD-10-CM

## 2014-08-25 DIAGNOSIS — J309 Allergic rhinitis, unspecified: Secondary | ICD-10-CM | POA: Diagnosis not present

## 2014-08-25 NOTE — Patient Instructions (Addendum)
Stop taking the Flonase and start Saline nasal spray Continue Loratadine Ibuprofen 800 mg every 8 hours as needed Continue the Penicillin and Lidocaine  Allergic Rhinitis Allergic rhinitis is when the mucous membranes in the nose respond to allergens. Allergens are particles in the air that cause your body to have an allergic reaction. This causes you to release allergic antibodies. Through a chain of events, these eventually cause you to release histamine into the blood stream. Although meant to protect the body, it is this release of histamine that causes your discomfort, such as frequent sneezing, congestion, and an itchy, runny nose.  CAUSES  Seasonal allergic rhinitis (hay fever) is caused by pollen allergens that may come from grasses, trees, and weeds. Year-round allergic rhinitis (perennial allergic rhinitis) is caused by allergens such as house dust mites, pet dander, and mold spores.  SYMPTOMS   Nasal stuffiness (congestion).  Itchy, runny nose with sneezing and tearing of the eyes. DIAGNOSIS  Your health care provider can help you determine the allergen or allergens that trigger your symptoms. If you and your health care provider are unable to determine the allergen, skin or blood testing may be used. TREATMENT  Allergic rhinitis does not have a cure, but it can be controlled by:  Medicines and allergy shots (immunotherapy).  Avoiding the allergen. Hay fever may often be treated with antihistamines in pill or nasal spray forms. Antihistamines block the effects of histamine. There are over-the-counter medicines that may help with nasal congestion and swelling around the eyes. Check with your health care provider before taking or giving this medicine.  If avoiding the allergen or the medicine prescribed do not work, there are many new medicines your health care provider can prescribe. Stronger medicine may be used if initial measures are ineffective. Desensitizing injections can be  used if medicine and avoidance does not work. Desensitization is when a patient is given ongoing shots until the body becomes less sensitive to the allergen. Make sure you follow up with your health care provider if problems continue. HOME CARE INSTRUCTIONS It is not possible to completely avoid allergens, but you can reduce your symptoms by taking steps to limit your exposure to them. It helps to know exactly what you are allergic to so that you can avoid your specific triggers. SEEK MEDICAL CARE IF:   You have a fever.  You develop a cough that does not stop easily (persistent).  You have shortness of breath.  You start wheezing.  Symptoms interfere with normal daily activities. Document Released: 12/04/2000 Document Revised: 03/16/2013 Document Reviewed: 11/16/2012 [facility] Patient Information 2015 [place], [place]. This information is not intended to replace advice given to you by your health care provider. Make sure you discuss any questions you have with your health care provider.

## 2014-08-25 NOTE — Progress Notes (Signed)
Pre visit review using our clinic review tool, if applicable. No additional management support is needed unless otherwise documented below in the visit note. 

## 2014-08-25 NOTE — Progress Notes (Signed)
Subjective:    Patient ID: Meghan Lucero, female    DOB: [birth year], 53 y.o.   MRN: [id number]  HPI  Pt presents to the clinic today with c/o sore throat and swollen glands. She reports this started 4-5 days ago. She was seen at the [facility] yesterday for the same. RST was negative. She was started on an antibiotic anyway and viscous Lidocaine. She has taken the medication with some relief. Her biggest concerns is still the swollen glands and pain in her neck. She does not feel comfortable taking the Lidocaine because she is scared she may swallow it. She also wants to know what is causing her symptoms if her strep was negative. She does have a runny nose. She has been using Flonase but her nose started bleeding. She denies fever, chills or body aches. She has taken Claritin and Ibuprofen OTC as well. She has no history of allergies or asthma. She has not had sick contacts.  Review of Systems      Past Medical History  Diagnosis Date  . Anemia     Current Outpatient Prescriptions  Medication Sig Dispense Refill  . EST ESTROGENS-METHYLTEST HS 0.625-1.25 MG per tablet Take 1 tablet by mouth daily.  5  . fluticasone (FLONASE) 50 MCG/ACT nasal spray Place 2 sprays into the nose daily.    Marland Kitchen LORATADINE PO Take 1 tablet by mouth daily.     No current facility-administered medications for this visit.    No Known Allergies  Family History  Problem Relation Age of Onset  . Stroke Father   . COPD Father   . Diabetes Father   . Heart disease Father   . Alzheimer's disease Maternal Grandmother   . Alcohol abuse Paternal Grandfather   . Osteoporosis Paternal Grandfather     History   Social History  . Marital Status: Single    Spouse Name: N/A  . Number of Children: 2  . Years of Education: N/A   Occupational History  . Land    Social History Main Topics  . Smoking status: Current Every Day Smoker  . Smokeless tobacco: Not on file  . Alcohol Use: No  . Drug Use: No  .  Sexual Activity: Not on file   Other Topics Concern  . Not on file   Social History Narrative     Constitutional: Denies fever, malaise, fatigue, headache or abrupt weight changes.  HEENT: Pt reports sore throat and swollen glands. Denies eye pain, eye redness, ear pain, ringing in the ears, wax buildup, or nasal congestion. Respiratory: Denies difficulty breathing, shortness of breath, cough or sputum production.   Cardiovascular: Denies chest pain, chest tightness, palpitations or swelling in the hands or feet.  Skin: Denies redness, rashes, lesions or ulcercations.   No other specific complaints in a complete review of systems (except as listed in HPI above).  Objective:   Physical Exam   BP 110/70 mmHg  Pulse 69  Temp(Src) 98.3 F (36.8 C) (Oral)  Wt 121 lb (54.885 kg)  SpO2 98% Wt Readings from Last 3 Encounters:  08/25/14 121 lb (54.885 kg)  08/10/14 124 lb 9 oz (56.501 kg)  04/06/12 126 lb (57.153 kg)    General: Appears her stated age, well developed, well nourished in NAD. Skin: Warm, dry and intact. No rashes, lesions or ulcerations noted. HEENT: Head: normal shape and size, no sinus tenderness noted; Eyes: sclera white, no icterus, conjunctiva pink; Ears: Tm's gray and intact, normal light reflex; Nose:  mucosa boggy and moist, septum midline; Throat/Mouth: Teeth present, mucosa erythematous and moist, + PND, no exudate, lesions or ulcerations noted.  Neck: No adenopathy noted. Cardiovascular: Normal rate and rhythm. S1,S2 noted.  No murmur, rubs or gallops noted. Pulmonary/Chest: Normal effort and positive vesicular breath sounds. No respiratory distress. No wheezes, rales or ronchi noted.    BMET    Component Value Date/Time   NA 139 10/07/2008 0958   K 4.1 10/07/2008 0958   CL 106 10/07/2008 0958   CO2 28 10/07/2008 0958   GLUCOSE 93 10/07/2008 0958   BUN 11 10/07/2008 0958   CREATININE 1.0 10/07/2008 0958   CALCIUM 9.3 10/07/2008 0958   GFRNONAA 63.25  10/07/2008 0958    Lipid Panel     Component Value Date/Time   CHOL 199 10/07/2008 0958   TRIG 58.0 10/07/2008 0958   HDL 49.20 10/07/2008 0958   CHOLHDL 4 10/07/2008 0958   VLDL 11.6 10/07/2008 0958   LDLCALC 138* 10/07/2008 0958    CBC    Component Value Date/Time   WBC 6.6 07/27/2014 0816   WBC 7.4 07/13/2014 1625   RBC 5.21* 07/27/2014 0816   RBC 5.21* 07/27/2014 0816   RBC 4.68 07/13/2014 1625   HGB 15.6 07/27/2014 0816   HGB 14.1 07/13/2014 1625   HCT 46.9 07/27/2014 0816   HCT 42.8 07/13/2014 1625   PLT 73* 07/27/2014 0816   PLT 78* 07/13/2014 1625   MCV 90.1 07/27/2014 0816   MCV 92 07/13/2014 1625   MCH 30.0 07/27/2014 0816   MCH 30.2 07/13/2014 1625   MCHC 33.3 07/27/2014 0816   MCHC 33.0 07/13/2014 1625   RDW 13.7 07/27/2014 0816   RDW 13.7 07/13/2014 1625   LYMPHSABS 2.0 07/27/2014 0816   LYMPHSABS 2.8 07/13/2014 1625   MONOABS 0.6 07/27/2014 0816   MONOABS 0.5 07/13/2014 1625   EOSABS 0.1 07/27/2014 0816   EOSABS 0.1 07/13/2014 1625   BASOSABS 0.1 07/27/2014 0816   BASOSABS 0.1 07/13/2014 1625    Hgb A1C No results found for: HGBA1C      Assessment & Plan:   Sore throat:  ? Viral but she has already been put on antibiotics so advised her to continue it Advised her that it is safe to take the Viscous Lidoacaine even if she swallows a small amount it wont hurt her She can try Ibuprofen 800 mg Q8H prn for pain/inflammation in the neck instead of the Viscous Lidocaine if she would like Return precautions given  Allergic Rhinitis:  Continue Claritin Stop Flonase given nose bleeds and start Saline nasal rinse  RTC as needed or if symptoms persist or worsen

## 2014-10-07 ENCOUNTER — Inpatient Hospital Stay: Payer: 59 | Attending: Internal Medicine

## 2014-10-07 DIAGNOSIS — D696 Thrombocytopenia, unspecified: Secondary | ICD-10-CM | POA: Insufficient documentation

## 2014-10-07 LAB — CBC WITH DIFFERENTIAL/PLATELET
Basophils Absolute: 0.1 10*3/uL (ref 0–0.1)
Basophils Relative: 1 %
EOS PCT: 2 %
Eosinophils Absolute: 0.2 10*3/uL (ref 0–0.7)
HCT: 45.7 % (ref 35.0–47.0)
Hemoglobin: 15.1 g/dL (ref 12.0–16.0)
Lymphocytes Relative: 29 %
Lymphs Abs: 2.1 10*3/uL (ref 1.0–3.6)
MCH: 29.6 pg (ref 26.0–34.0)
MCHC: 33 g/dL (ref 32.0–36.0)
MCV: 89.5 fL (ref 80.0–100.0)
MONOS PCT: 7 %
Monocytes Absolute: 0.5 10*3/uL (ref 0.2–0.9)
Neutro Abs: 4.6 10*3/uL (ref 1.4–6.5)
Neutrophils Relative %: 61 %
Platelets: 78 10*3/uL — ABNORMAL LOW (ref 150–440)
RBC: 5.11 MIL/uL (ref 3.80–5.20)
RDW: 13.7 % (ref 11.5–14.5)
WBC: 7.5 10*3/uL (ref 3.6–11.0)

## 2014-10-20 ENCOUNTER — Telehealth: Payer: Self-pay | Admitting: *Deleted

## 2014-10-20 NOTE — Telephone Encounter (Signed)
Mammogram call: left voicemail requesting pt to call office back so we can check status of her mammogram

## 2014-12-02 ENCOUNTER — Inpatient Hospital Stay: Payer: 59 | Attending: Internal Medicine

## 2015-01-27 ENCOUNTER — Inpatient Hospital Stay: Payer: 59 | Attending: Internal Medicine

## 2015-03-24 ENCOUNTER — Inpatient Hospital Stay: Payer: 59 | Attending: Internal Medicine

## 2015-05-19 ENCOUNTER — Inpatient Hospital Stay: Payer: Self-pay | Attending: Internal Medicine

## 2015-07-13 ENCOUNTER — Other Ambulatory Visit: Payer: Self-pay | Admitting: *Deleted

## 2015-07-13 ENCOUNTER — Encounter: Payer: Self-pay | Admitting: *Deleted

## 2015-07-13 DIAGNOSIS — D696 Thrombocytopenia, unspecified: Secondary | ICD-10-CM

## 2015-07-14 ENCOUNTER — Inpatient Hospital Stay: Payer: Self-pay | Admitting: Internal Medicine

## 2015-07-14 ENCOUNTER — Inpatient Hospital Stay: Payer: Self-pay

## 2015-07-14 ENCOUNTER — Encounter: Payer: Self-pay | Admitting: *Deleted

## 2015-07-28 ENCOUNTER — Inpatient Hospital Stay: Payer: Self-pay

## 2015-07-28 ENCOUNTER — Ambulatory Visit: Payer: Self-pay | Admitting: Internal Medicine

## 2015-07-28 ENCOUNTER — Inpatient Hospital Stay: Payer: Self-pay | Admitting: Internal Medicine

## 2015-08-11 ENCOUNTER — Inpatient Hospital Stay: Payer: Self-pay

## 2015-08-11 ENCOUNTER — Encounter: Payer: Self-pay | Admitting: *Deleted

## 2015-08-11 ENCOUNTER — Inpatient Hospital Stay: Payer: Self-pay | Admitting: Internal Medicine

## 2016-04-27 IMAGING — US ABDOMEN ULTRASOUND
1 series · 14 of 25 positions shown · non-contrast
Comparison: None.

CLINICAL DATA: Persistent thrombocytopenia. Evaluate for
splenomegaly or hepatic megaly.

EXAM:
ULTRASOUND ABDOMEN COMPLETE

[Series 1: abdomen ultrasound · 0.15mm/px · 14 of 192 slices shown]
[im 1/192]
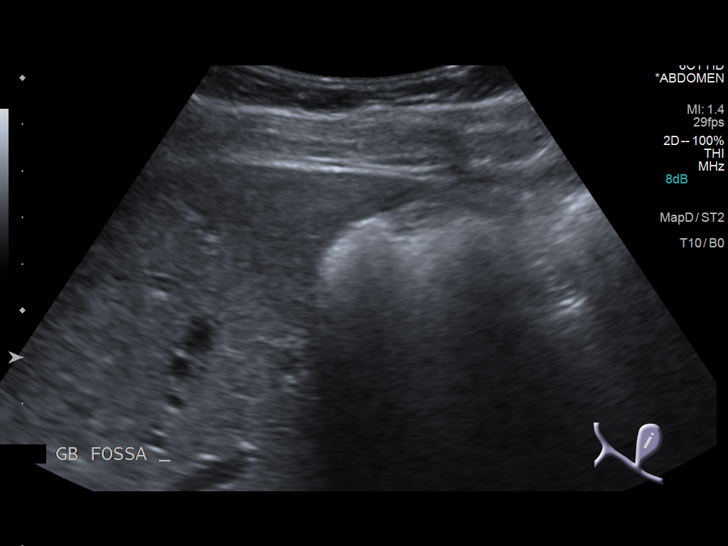
[im 16/192]
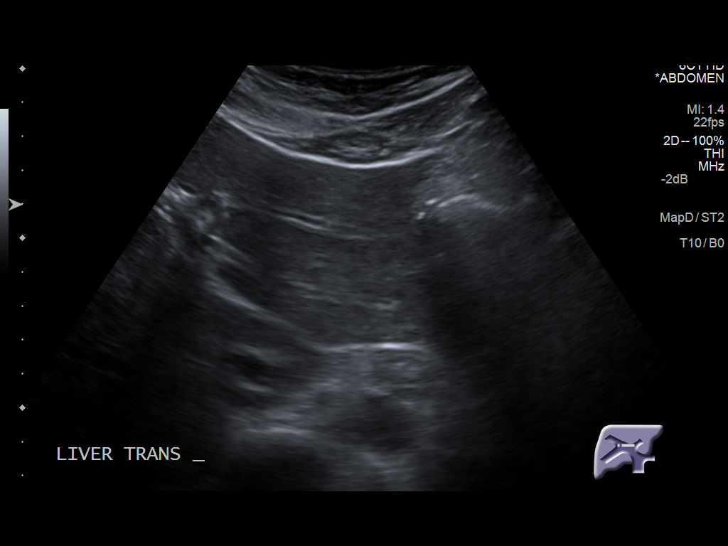
[im 32/192]
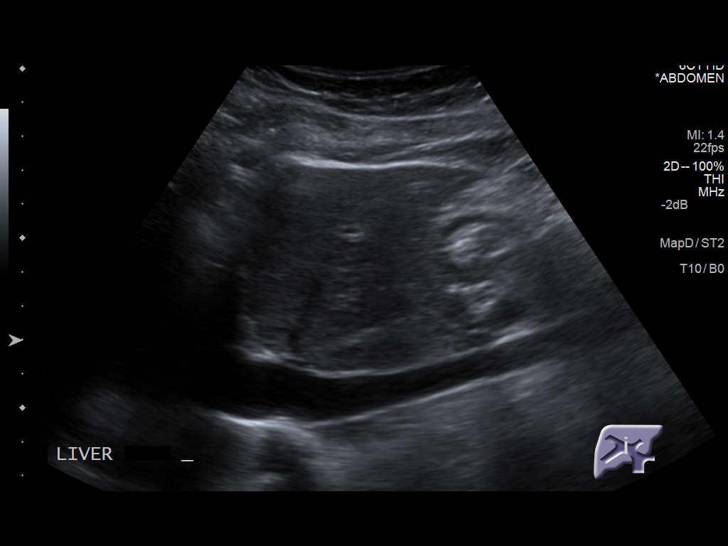
[im 48/192]
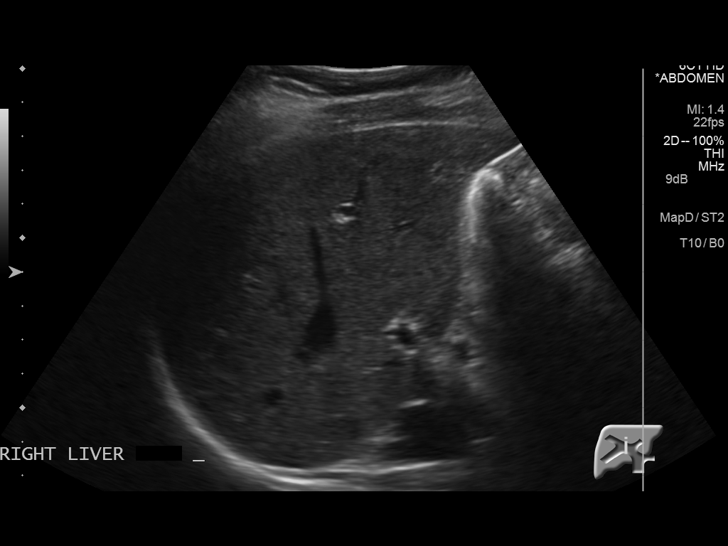
[im 64/192]
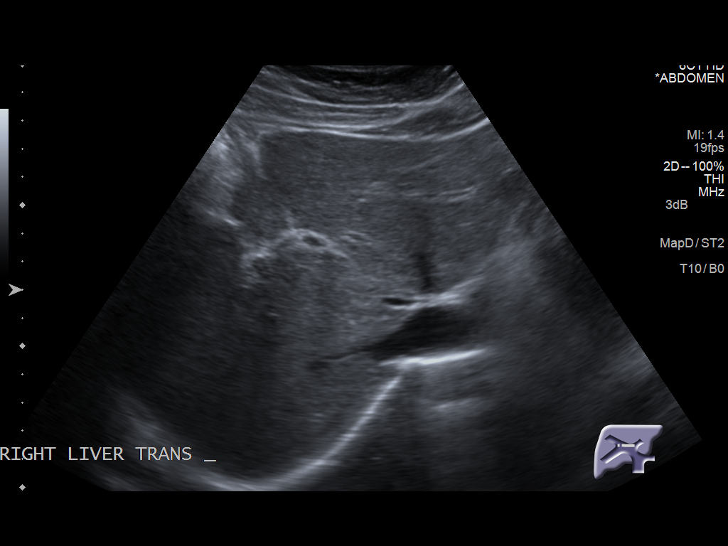
[im 72/192]
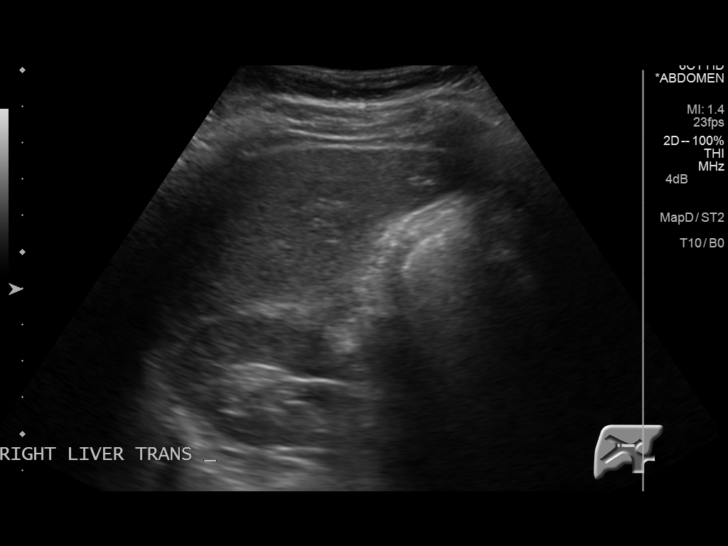
[im 88/192]
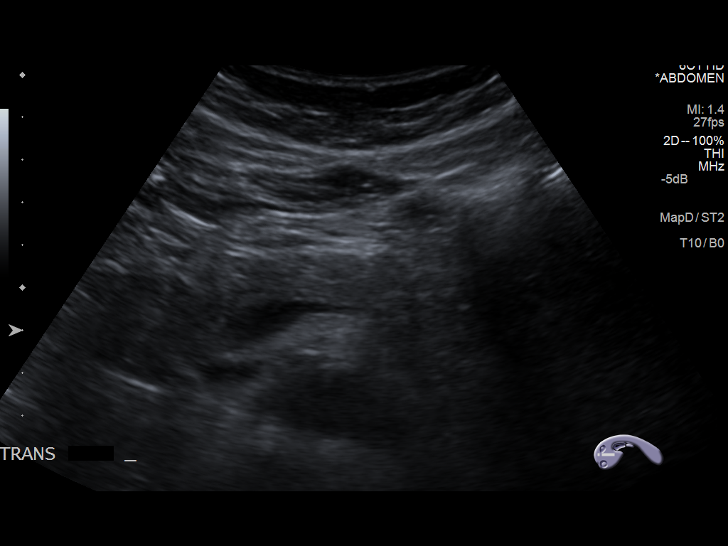
[im 104/192]
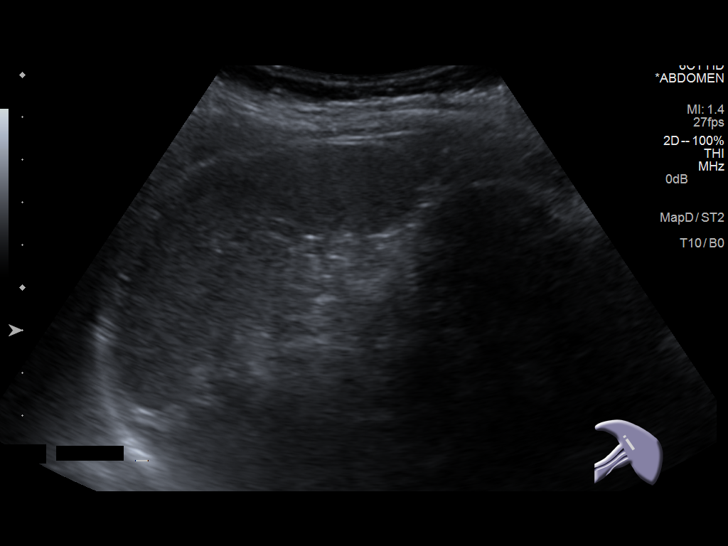
[im 120/192]
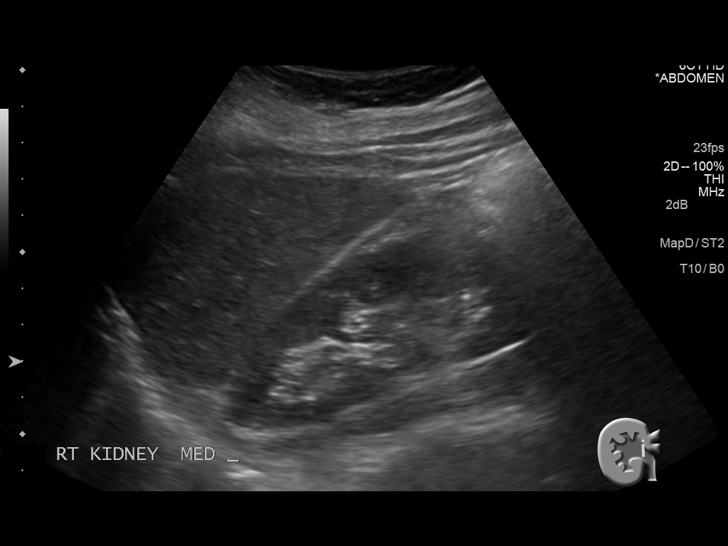
[im 128/192]
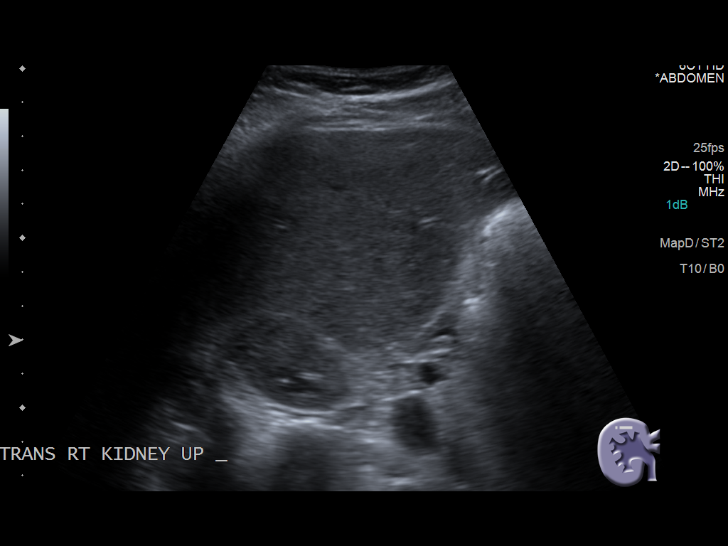
[im 144/192]
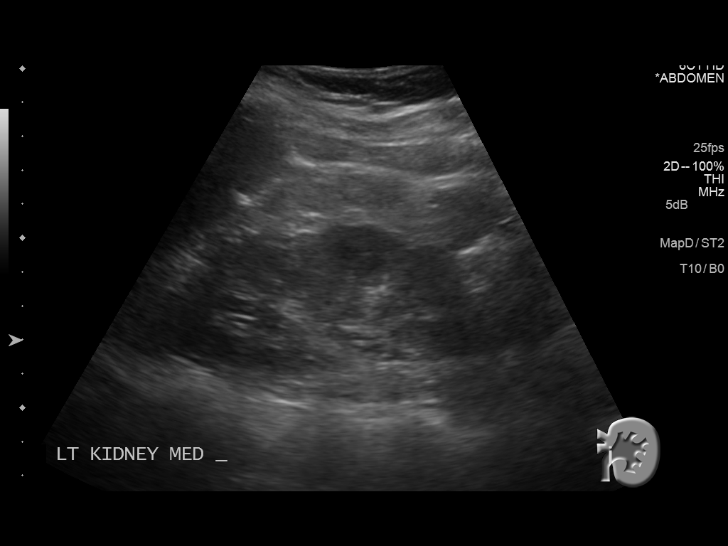
[im 160/192]
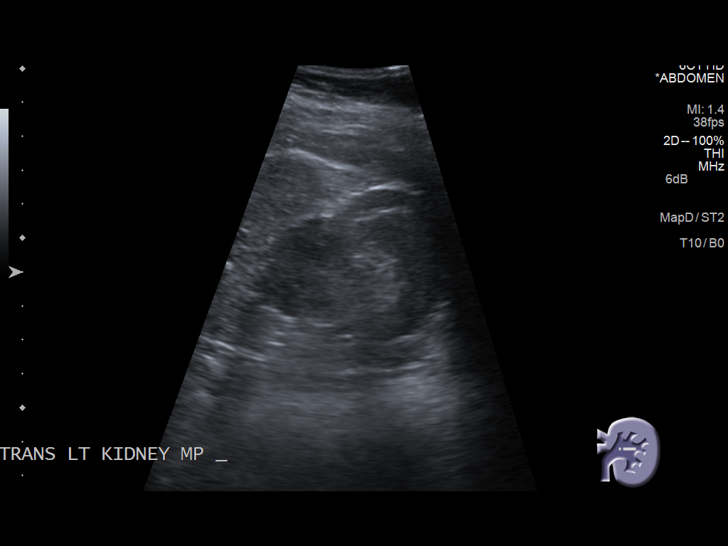
[im 176/192]
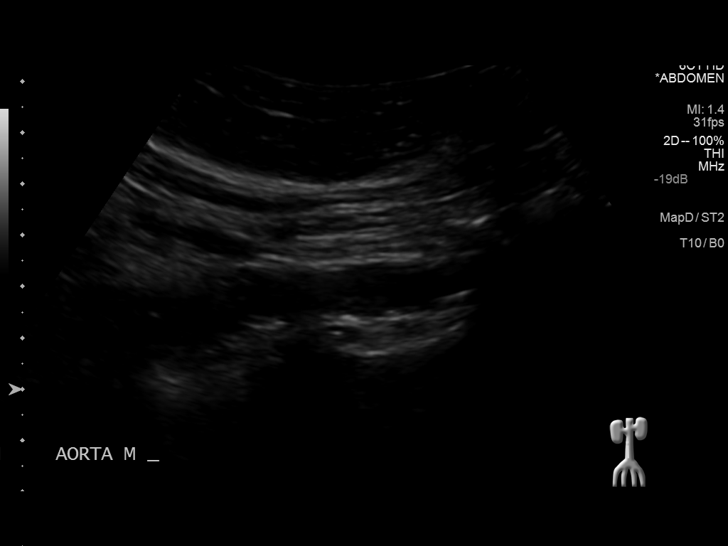
[im 192/192]
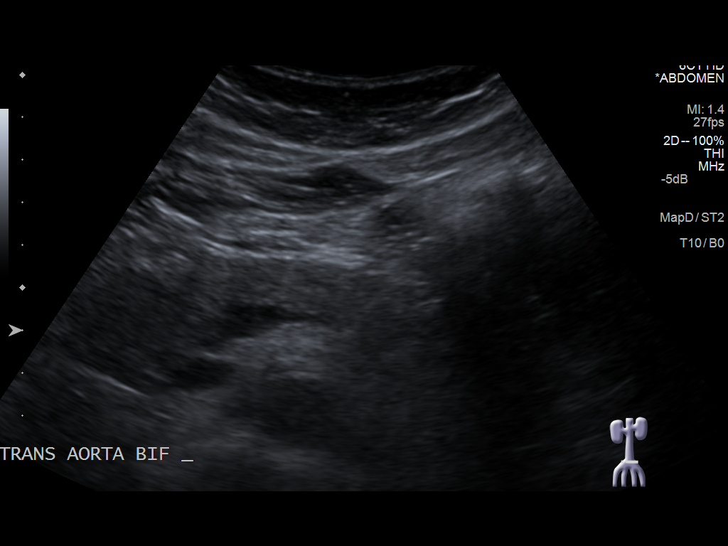

[14 of 25 positions shown; findings below may reference images not displayed]

FINDINGS: Gallbladder: Previous cholecystectomy.

Common bile duct: Diameter: 2.7 mm.

Liver: No focal lesion identified. Within normal limits in
parenchymal echogenicity.

IVC: No abnormality visualized.

Pancreas: Visualized portion unremarkable.

Spleen: Measures 6 cm in length.

Right Kidney: Length: 10.2 cm.. Echogenicity within normal limits.
No mass or hydronephrosis visualized.

Left Kidney: Length: 10 cm.. Echogenicity within normal limits. No
mass or hydronephrosis visualized.

Abdominal aorta: No aneurysm visualized.

Other findings: None.
IMPRESSION: 1. No evidence for hepatosplenomegaly.
